# Patient Record
Sex: Male | Born: 1965 | Race: White | Hispanic: No | Marital: Married | Smoking: Former smoker
Health system: Southern US, Community
[De-identification: ages and names within clinical notes are randomized; demographics above are authoritative.]

## PROBLEM LIST (undated history)

## (undated) DIAGNOSIS — J302 Other seasonal allergic rhinitis: Secondary | ICD-10-CM

## (undated) DIAGNOSIS — K219 Gastro-esophageal reflux disease without esophagitis: Secondary | ICD-10-CM

## (undated) HISTORY — PX: NASAL SINUS SURGERY: SHX719

---

## 2004-11-02 ENCOUNTER — Ambulatory Visit: Payer: Self-pay | Admitting: Unknown Physician Specialty

## 2005-01-02 ENCOUNTER — Ambulatory Visit: Payer: Self-pay | Admitting: Unknown Physician Specialty

## 2008-10-14 ENCOUNTER — Ambulatory Visit: Payer: Self-pay | Admitting: Family Medicine

## 2008-12-06 ENCOUNTER — Ambulatory Visit: Payer: Self-pay | Admitting: Internal Medicine

## 2010-08-30 ENCOUNTER — Ambulatory Visit: Payer: Self-pay | Admitting: Internal Medicine

## 2011-01-26 ENCOUNTER — Ambulatory Visit: Payer: Self-pay | Admitting: Internal Medicine

## 2011-06-26 ENCOUNTER — Ambulatory Visit: Payer: Self-pay | Admitting: Internal Medicine

## 2011-08-09 ENCOUNTER — Ambulatory Visit: Payer: Self-pay

## 2011-09-13 ENCOUNTER — Ambulatory Visit: Payer: Self-pay

## 2011-09-26 ENCOUNTER — Ambulatory Visit: Payer: Self-pay | Admitting: Internal Medicine

## 2013-12-19 IMAGING — CR DG CHEST 2V
1 series · 2 of 2 positions shown · non-contrast
Comparison: none

REASON FOR EXAM: cough
COMMENTS:   LMP: (Male)

[Series 1: pa · 0.17mm/px · 2 of 2 slices shown]
[im 1/2]
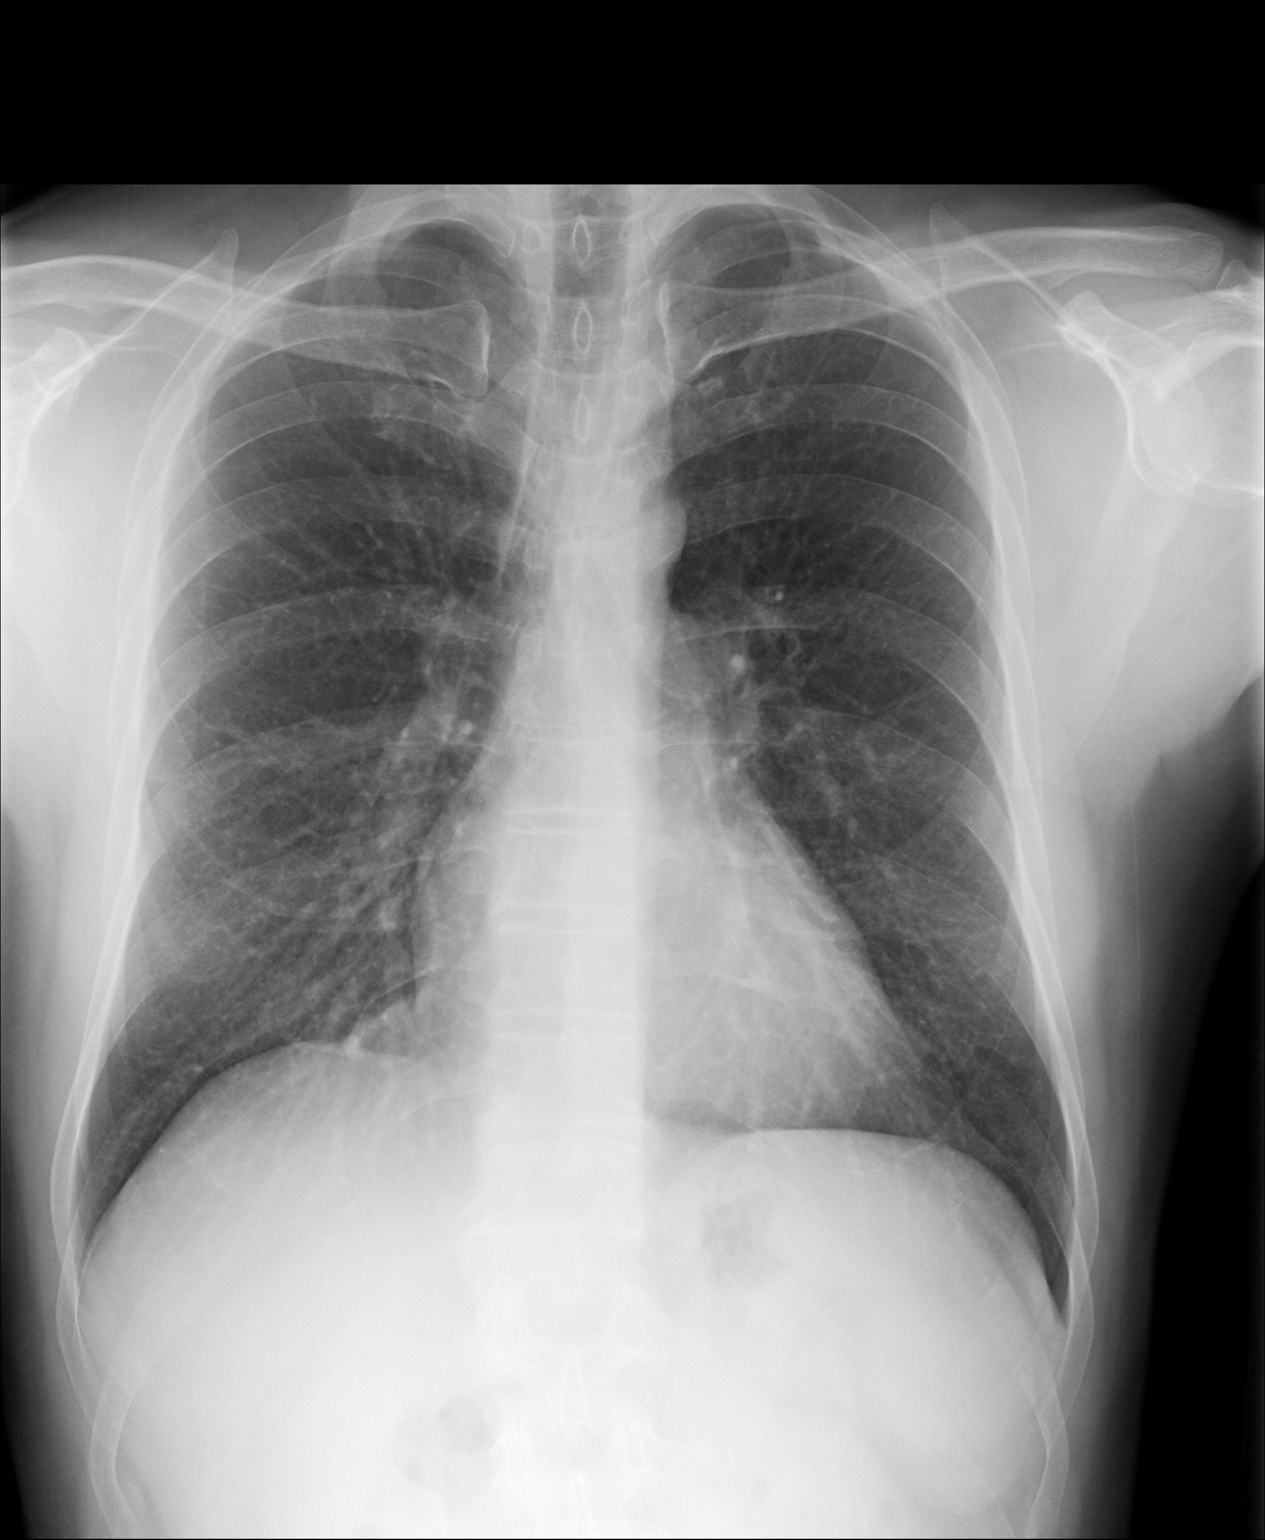
[im 2/2]
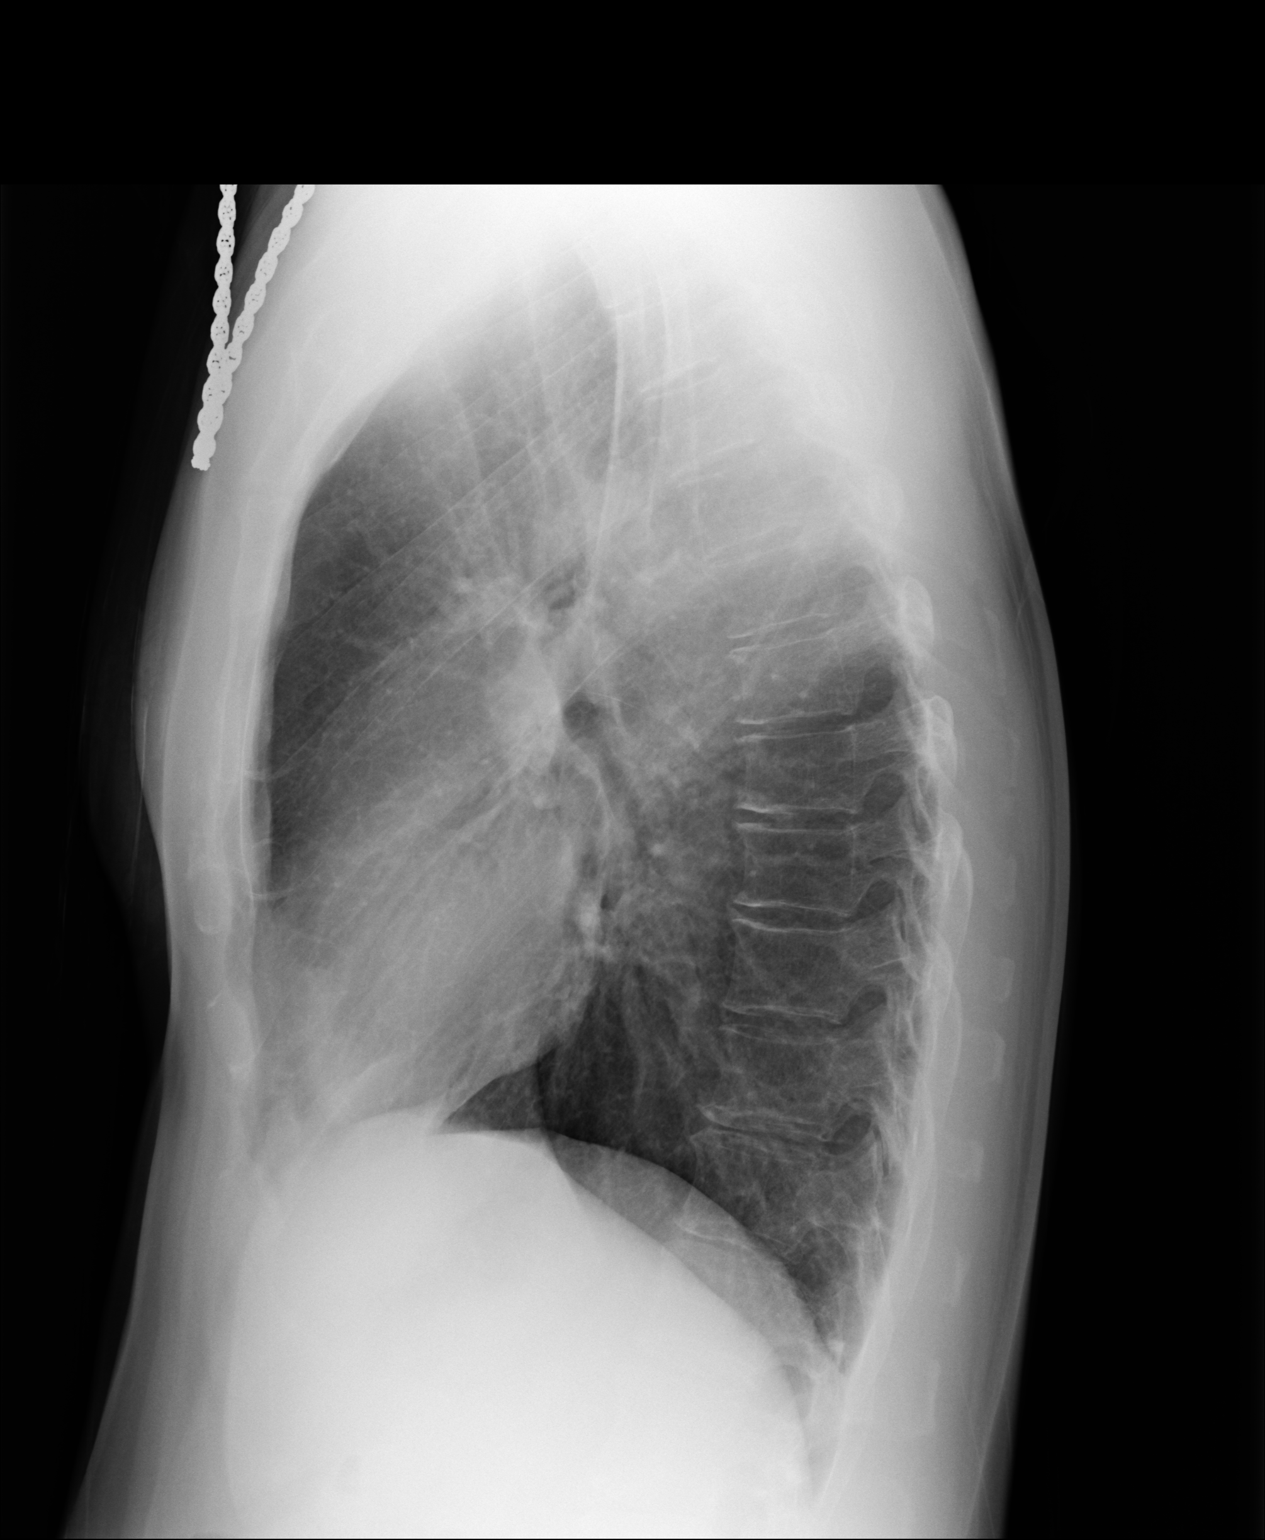

[2 of 2 positions shown; findings below may reference images not displayed]

PROCEDURE:     MDR - MDR CHEST PA(OR AP) AND LATERAL  - August 09, 2011  [DATE]

RESULT:     The lung fields are clear. No pneumonia, pneumothorax or pleural
effusion is seen. Heart size is normal. The mediastinal and osseous
structures show no significant abnormalities. The chest appears mildly
hyperinflated bilaterally which suggest a history of COPD or asthma.
IMPRESSION: 1. No acute changes are identified.
2. The chest appears mildly hyperinflated bilaterally.

## 2018-03-24 ENCOUNTER — Ambulatory Visit
Admission: EM | Admit: 2018-03-24 | Discharge: 2018-03-24 | Disposition: A | Discharge status: Home / Self Care | Payer: BLUE CROSS/BLUE SHIELD | Attending: Family Medicine | Admitting: Family Medicine

## 2018-03-24 ENCOUNTER — Other Ambulatory Visit: Payer: Self-pay

## 2018-03-24 DIAGNOSIS — J019 Acute sinusitis, unspecified: Secondary | ICD-10-CM | POA: Diagnosis not present

## 2018-03-24 DIAGNOSIS — R05 Cough: Secondary | ICD-10-CM | POA: Diagnosis not present

## 2018-03-24 HISTORY — DX: Gastro-esophageal reflux disease without esophagitis: K21.9

## 2018-03-24 HISTORY — DX: Other seasonal allergic rhinitis: J30.2

## 2018-03-24 MED ORDER — HYDROCOD POLST-CPM POLST ER 10-8 MG/5ML PO SUER: 5.0000 mL | Freq: Every evening | ORAL | 0 refills | Status: AC | PRN

## 2018-03-24 MED ORDER — AMOXICILLIN-POT CLAVULANATE 875-125 MG PO TABS: 1.0000 | ORAL_TABLET | Freq: Two times a day (BID) | ORAL | 0 refills | Status: AC

## 2018-03-24 NOTE — ED Provider Notes (Signed)
[facility]    CSN: [id number] Arrival date & time: 03/24/18  1008  History   Chief Complaint Chief Complaint  Patient presents with  . Cough   HPI  52 year old male presents with upper respiratory symptoms.  Patient reports he been sick for the past week.  He reports congestion, cough, postnasal drip.  Reports generalized malaise and fatigue.  Feels poorly.  Symptoms are severe.  Worsening.  He has been taking over-the-counter medication without improvement.  No reported sick contacts.  No other associated symptoms.  No other complaints.  Past Medical History:  Diagnosis Date  . GERD (gastroesophageal reflux disease)   . Seasonal allergies    Past Surgical History:  Procedure Laterality Date  . NASAL SINUS SURGERY         Home Medications    Prior to Admission medications   Medication Sig Start Date End Date Taking? Authorizing Provider  loratadine (CLARITIN) 10 MG tablet Take 10 mg by mouth daily.   Yes [provider]  omeprazole (PRILOSEC) 20 MG capsule Take 20 mg by mouth daily.   Yes [provider]  vitamin B-12 (CYANOCOBALAMIN) 1000 MCG tablet Take 1,000 mcg by mouth daily.   Yes [provider]  amoxicillin-clavulanate (AUGMENTIN) 875-125 MG tablet Take 1 tablet by mouth every 12 (twelve) hours. 03/24/18   Tommie Sams, DO  chlorpheniramine-HYDROcodone (TUSSIONEX PENNKINETIC ER) 10-8 MG/5ML SUER Take 5 mLs by mouth at bedtime as needed. 03/24/18   Tommie Sams, DO    Social History Social History   Tobacco Use  . Smoking status: Former Games developer  . Smokeless tobacco: Never Used  Substance Use Topics  . Alcohol use: Yes    Comment: social  . Drug use: Not on file     Allergies   Patient has no known allergies.   Review of Systems Review of Systems  Constitutional: Positive for fatigue.  HENT: Positive for congestion and postnasal drip.   Respiratory: Positive for cough.     Physical Exam Triage Vital Signs ED  Triage Vitals  Enc Vitals Group     BP 03/24/18 1021 126/82     Pulse Rate 03/24/18 1021 78     Resp 03/24/18 1021 18     Temp 03/24/18 1021 98.5 F (36.9 C)     Temp Source 03/24/18 1021 Oral     SpO2 03/24/18 1021 97 %     Weight 03/24/18 1023 155 lb (70.3 kg)     Height 03/24/18 1023 5\' 4"  (1.626 m)     Head Circumference --      Peak Flow --      Pain Score 03/24/18 1022 0     Pain Loc --      Pain Edu? --      Excl. in GC? --    Updated Vital Signs BP 126/82 (BP Location: Left Arm)   Pulse 78   Temp 98.5 F (36.9 C) (Oral)   Resp 18   Ht 5\' 4"  (1.626 m)   Wt 155 lb (70.3 kg)   SpO2 97%   BMI 26.61 kg/m   Visual Acuity Right Eye Distance:   Left Eye Distance:   Bilateral Distance:    Right Eye Near:   Left Eye Near:    Bilateral Near:     Physical Exam  Constitutional: He is oriented to person, place, and time. He appears well-developed. No distress.  HENT:  Head: Normocephalic and atraumatic.  Oropharynx with mild erythema.  Cardiovascular: Normal rate and regular rhythm.  Pulmonary/Chest: Effort normal and breath sounds normal. He has no wheezes. He has no rales.  Neurological: He is alert and oriented to person, place, and time.  Psychiatric: He has a normal mood and affect. His behavior is normal.  Nursing note and vitals reviewed.  UC Treatments / Results  Labs (all labs ordered are listed, but only abnormal results are displayed) Labs Reviewed - No data to display  EKG None  Radiology No results found.  Procedures Procedures (including critical care time)  Medications Ordered in UC Medications - No data to display  Initial Impression / Assessment and Plan / UC Course  I have reviewed the triage vital signs and the nursing notes.  Pertinent labs & imaging results that were available during my care of the patient were reviewed by me and considered in my medical decision making (see chart for details).    52 year old male presents with  sinusitis.  Treating with Augmentin.  Tussionex for cough.  Final Clinical Impressions(s) / UC Diagnoses   Final diagnoses:  Acute sinusitis, recurrence not specified, unspecified location   Discharge Instructions   None    ED Prescriptions    Medication Sig Dispense Auth. Provider   amoxicillin-clavulanate (AUGMENTIN) 875-125 MG tablet Take 1 tablet by mouth every 12 (twelve) hours. 14 tablet Holcombook, [place] G, DO   chlorpheniramine-HYDROcodone (TUSSIONEX PENNKINETIC ER) 10-8 MG/5ML SUER Take 5 mLs by mouth at bedtime as needed. 60 mL Tommie Samsook, Jonessa Triplett G, DO     Controlled Substance Prescriptions [place] Controlled Substance Registry consulted? Not Applicable   Tommie SamsCook, Jizelle Conkey G, DO 03/24/18 1139

## 2018-03-24 NOTE — ED Triage Notes (Signed)
Pt with nasal congestion and cough since Tuesday.
